# Patient Record
Sex: Male | Born: 1990 | Hispanic: Yes | Marital: Married | Smoking: Never smoker
Health system: Southern US, Community
[De-identification: ages and names within clinical notes are randomized; demographics above are authoritative.]

---

## 2017-01-04 ENCOUNTER — Encounter (HOSPITAL_COMMUNITY): Payer: Self-pay

## 2017-01-04 ENCOUNTER — Ambulatory Visit (HOSPITAL_COMMUNITY)
Admission: EM | Admit: 2017-01-04 | Discharge: 2017-01-04 | Disposition: A | Discharge status: Home / Self Care | Payer: Self-pay | Attending: Emergency Medicine | Admitting: Emergency Medicine

## 2017-01-04 ENCOUNTER — Ambulatory Visit (INDEPENDENT_AMBULATORY_CARE_PROVIDER_SITE_OTHER): Payer: Self-pay

## 2017-01-04 DIAGNOSIS — S93401A Sprain of unspecified ligament of right ankle, initial encounter: Secondary | ICD-10-CM

## 2017-01-04 DIAGNOSIS — M25571 Pain in right ankle and joints of right foot: Secondary | ICD-10-CM

## 2017-01-04 MED ORDER — IBUPROFEN 600 MG PO TABS: 600.0000 mg | ORAL_TABLET | Freq: Four times a day (QID) | ORAL | 0 refills | Status: AC | PRN

## 2017-01-04 NOTE — Discharge Instructions (Addendum)
Rest,ice,elevate,wear ace wrap/crutches.Your xray was negative for fracture. You will be sore for several days. Follow up with PCP in 1 wk if symptoms persist, may need referral to Ortho if pain persists. Take Ibuprofen as directed for pain.

## 2017-01-04 NOTE — ED Provider Notes (Signed)
CSN: [id number]     Arrival date & time 01/04/17  1029 History   None    Chief Complaint  Patient presents with  . Ankle Pain   (Consider location/radiation/quality/duration/timing/severity/associated sxs/prior Treatment) The history is provided by the patient. The history is limited by a language barrier. A language interpreter was used Byrd Hesselbach).  Ankle Pain  Location:  Ankle Time since incident:  1 day Injury: yes   Ankle location:  R ankle Pain details:    Quality:  Pressure   Radiates to:  Does not radiate   Severity:  Moderate   Onset quality:  Sudden   Duration:  1 day   Timing:  Constant   Progression:  Unchanged Chronicity:  New (pt was playingf soccer and rolled right ankle) Dislocation: no   Foreign body present:  No foreign bodies Tetanus status:  Up to date Prior injury to area:  No Relieved by:  Rest Worsened by:  Activity and bearing weight Ineffective treatments:  Acetaminophen Associated symptoms: swelling   Associated symptoms: no fever   Risk factors: no known bone disorder     History reviewed. No pertinent past medical history. History reviewed. No pertinent surgical history. No family history on file. Social History  Substance Use Topics  . Smoking status: Never Smoker  . Smokeless tobacco: Never Used  . Alcohol use 1.2 oz/week    2 Standard drinks or equivalent per week    Review of Systems  Constitutional: Positive for activity change. Negative for appetite change and fever.  HENT: Negative.   Respiratory: Negative.  Negative for shortness of breath.   Cardiovascular: Negative for chest pain.  Gastrointestinal: Negative for abdominal pain.  Endocrine: Negative.   Genitourinary: Negative for dysuria.  Musculoskeletal: Positive for arthralgias, gait problem, joint swelling and myalgias.  Skin: Negative for color change and wound.  Allergic/Immunologic: Negative.   Neurological: Negative for headaches.  Hematological: Negative.    Psychiatric/Behavioral: Negative.   All other systems reviewed and are negative.   Allergies  Patient has no known allergies.  Home Medications   Prior to Admission medications   Medication Sig Start Date End Date Taking? Authorizing Provider  ibuprofen (ADVIL,MOTRIN) 600 MG tablet Take 1 tablet (600 mg total) by mouth every 6 (six) hours as needed. 01/04/17   Clancy Gourd, NP   Meds Ordered and Administered this Visit  Medications - No data to display  BP 114/73 (BP Location: Left Arm)   Pulse 64   Temp 98.2 F (36.8 C) (Oral)   Resp 20   SpO2 98%  No data found.   Physical Exam  Constitutional: He is oriented to person, place, and time. Vital signs are normal. He appears well-developed and well-nourished. He is active and cooperative.  HENT:  Head: Normocephalic.  Eyes: Pupils are equal, round, and reactive to light.  Neck: Normal range of motion.  Cardiovascular: Normal rate, regular rhythm, intact distal pulses and normal pulses.   Pulses:      Dorsalis pedis pulses are 2+ on the right side, and 2+ on the left side.  Pulmonary/Chest: Effort normal.  Musculoskeletal: Normal range of motion.       Right ankle: He exhibits swelling. He exhibits normal range of motion, no ecchymosis, no deformity, no laceration and normal pulse. Tenderness. Medial malleolus tenderness found.  Neurological: He is alert and oriented to person, place, and time. No cranial nerve deficit or sensory deficit. GCS eye subscore is 4. GCS verbal subscore is 5. GCS motor subscore  is 6.  Skin: Skin is warm and dry. Capillary refill takes 2 to 3 seconds. No ecchymosis noted.  Psychiatric: He has a normal mood and affect. His speech is normal.  Nursing note and vitals reviewed.   Urgent Care Course     Procedures (including critical care time)  Labs Review Labs Reviewed - No data to display  Imaging Review Dg Ankle Complete Right  Result Date: 01/04/2017 CLINICAL DATA:  Right ankle pain  after soccer injury yesterday. EXAM: RIGHT ANKLE - COMPLETE 3+ VIEW COMPARISON:  None. FINDINGS: There is no evidence of fracture, dislocation, or joint effusion. There is no evidence of arthropathy or other focal bone abnormality. Soft tissue swelling is seen over the medial malleolus. IMPRESSION: Soft tissue swelling seen over medial malleolus suggesting ligamentous injury. No fracture or dislocation is noted. Electronically Signed   By: Lupita Raider, M.D.   On: 01/04/2017 11:03        MDM   1. Moderate right ankle sprain, initial encounter   2. Acute right ankle pain     Ace wrap/crutches provided. NV intact pre/psot ace application. Discussed plan of care with pt: Rest,ice,elevate,wear ace wrap/crutches.Your xray was negative for fracture. You will be sore for several days. Follow up with PCP in 1 wk if symptoms persist, may need referral to Ortho, [place],  if pain persists. Take Ibuprofen as directed for pain. Pt verbalized understanding to this provider.    Clancy Gourd, NP 01/04/17 1201

## 2017-01-04 NOTE — ED Triage Notes (Signed)
Pt was playing soccer yesterday and injured his right ankle. Hurts to bear pressure on it. Swelling and has been taking motrin for the pain.

## 2018-06-21 IMAGING — DX DG ANKLE COMPLETE 3+V*R*
3 series · 3 of 3 positions shown · non-contrast
Comparison: None.

CLINICAL DATA: Right ankle pain after soccer injury yesterday.

EXAM:
RIGHT ANKLE - COMPLETE 3+ VIEW

[ankle ap]
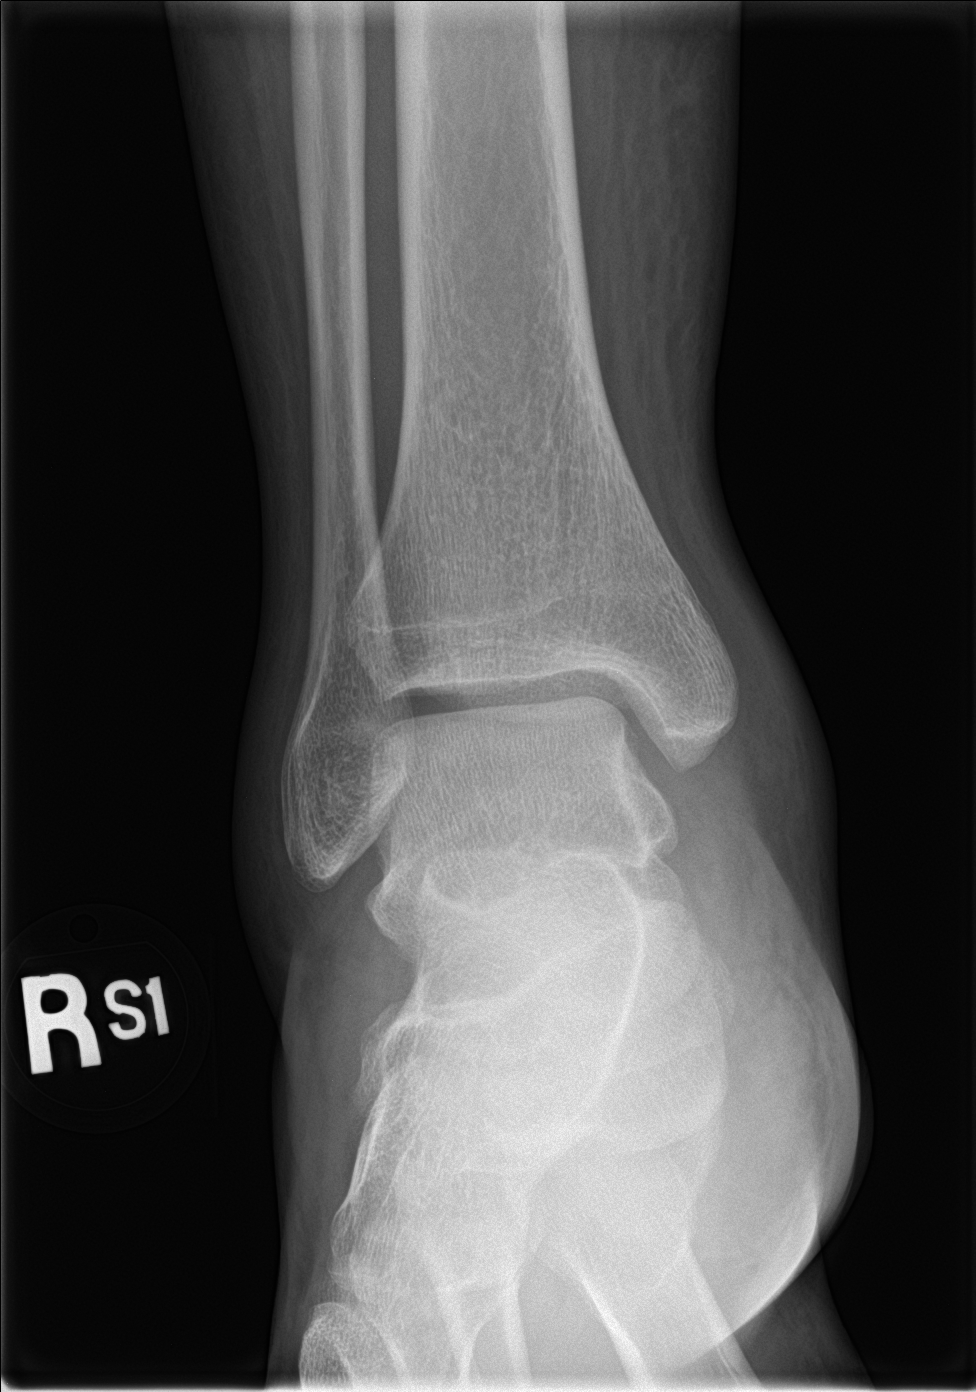

[ankle obl]
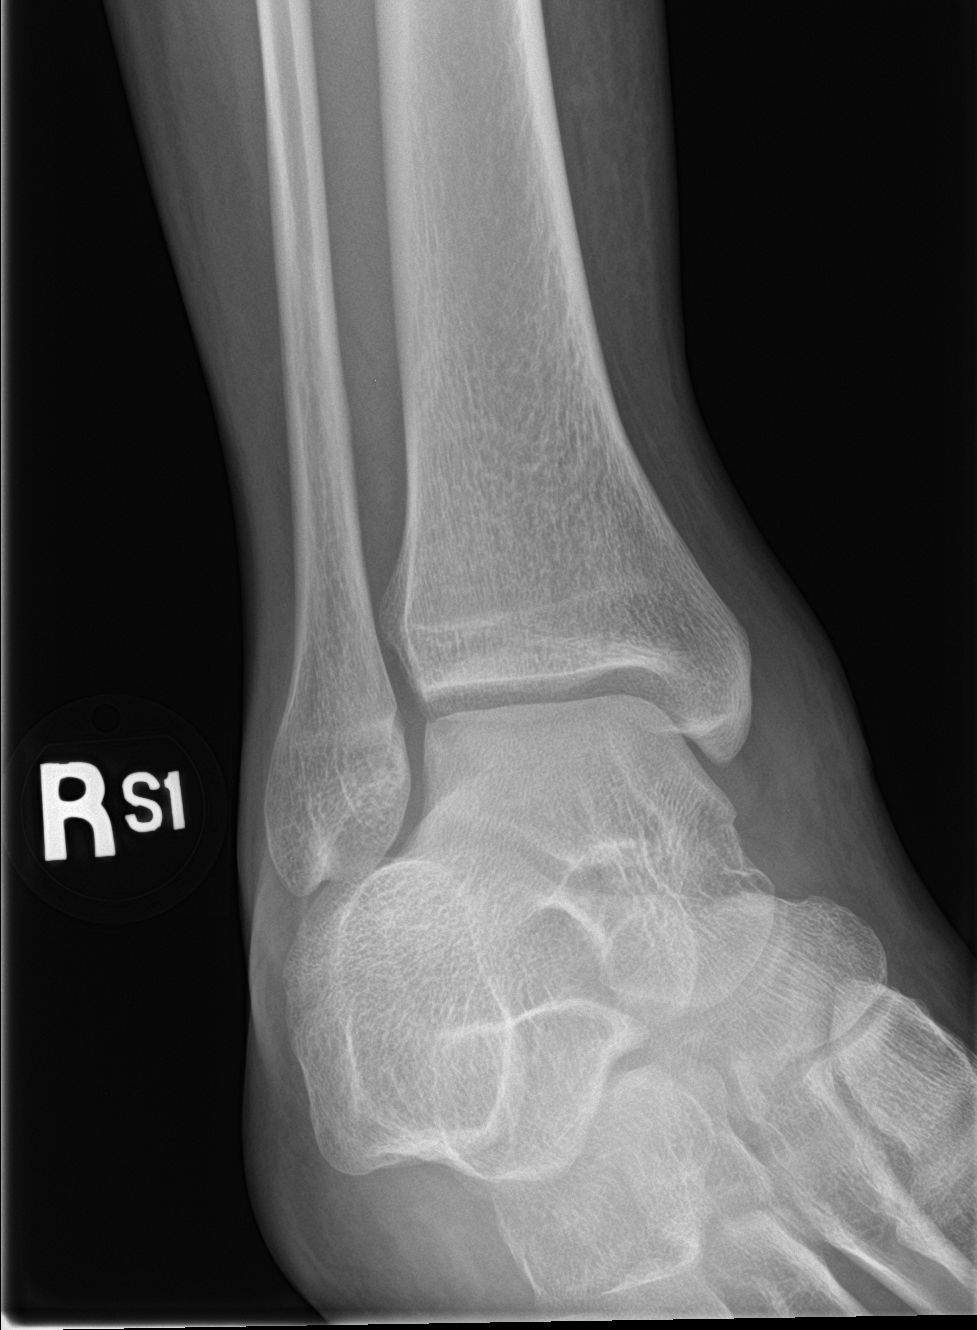

[ankle lat]
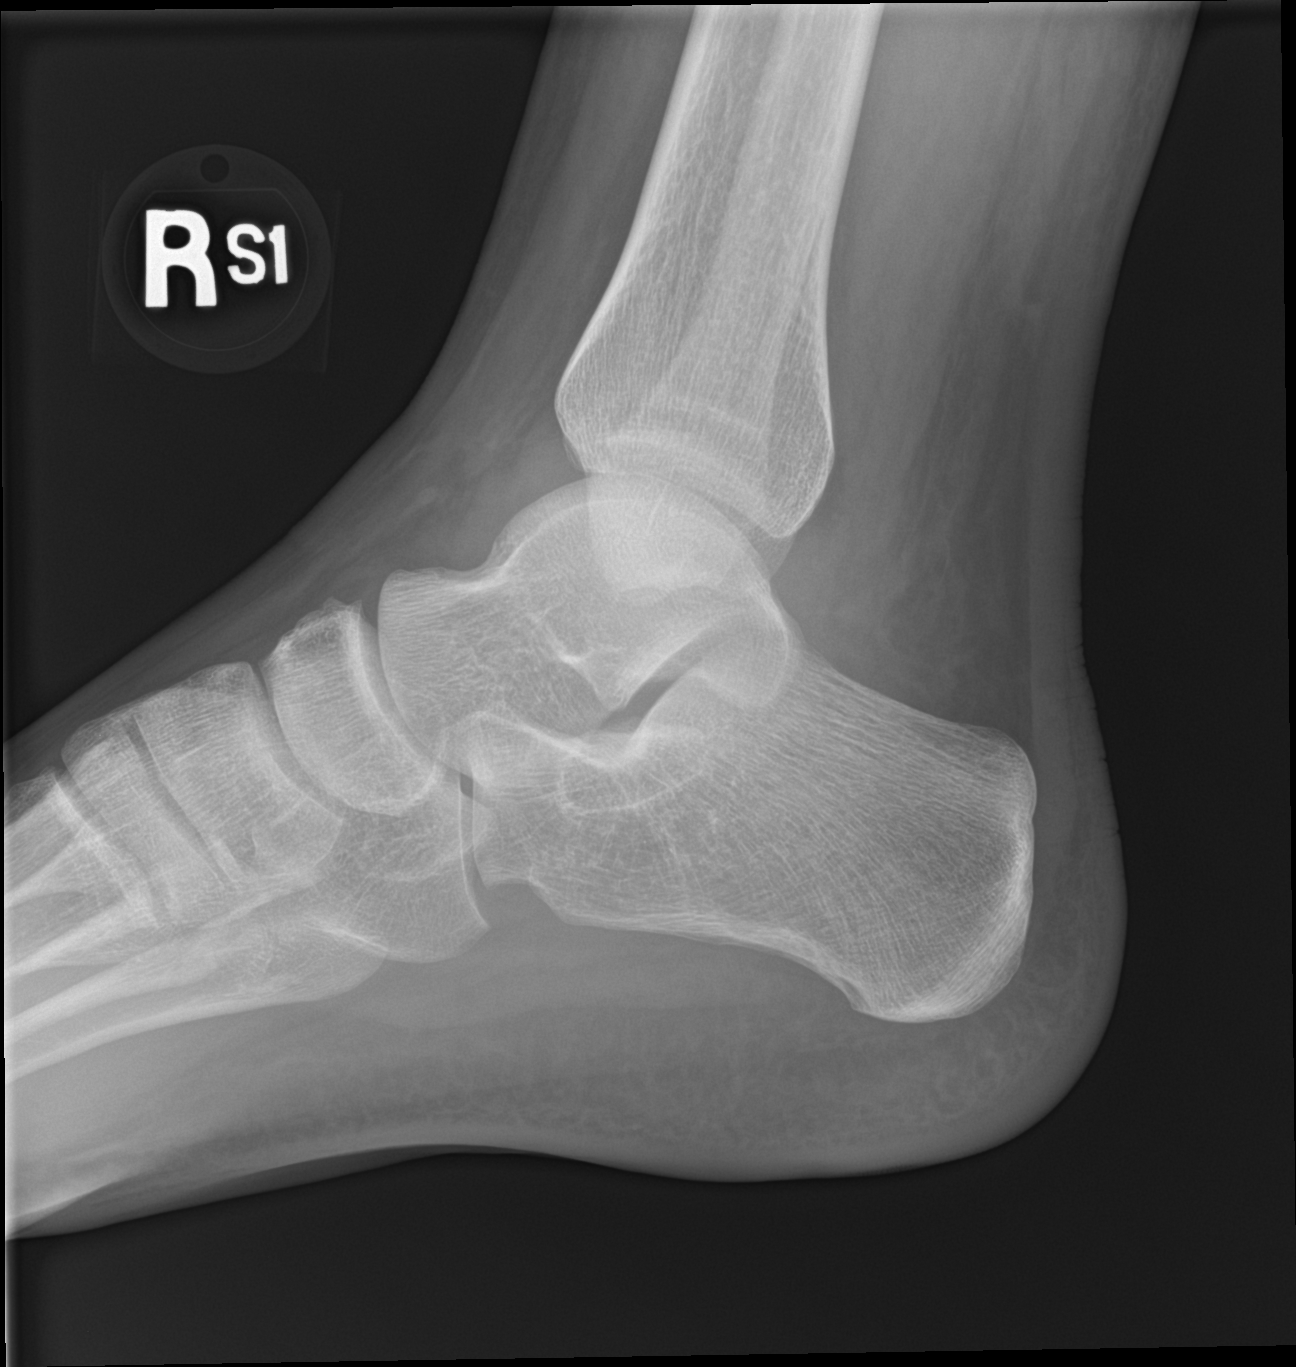

[3 of 3 positions shown; findings below may reference images not displayed]

FINDINGS: There is no evidence of fracture, dislocation, or joint effusion.
There is no evidence of arthropathy or other focal bone abnormality.
Soft tissue swelling is seen over the medial malleolus.
IMPRESSION: Soft tissue swelling seen over medial malleolus suggesting
ligamentous injury. No fracture or dislocation is noted.
